# Patient Record
Sex: Male | Born: 1990 | Race: Black or African American | Hispanic: No | Marital: Single
Health system: Midwestern US, Academic
[De-identification: ages and names within clinical notes are randomized; demographics above are authoritative.]

---
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
## 2020-02-01 ENCOUNTER — Emergency Department: Admit: 2020-02-01 | Discharge: 2020-02-01

## 2020-02-01 ENCOUNTER — Encounter: Admit: 2020-02-01 | Discharge: 2020-02-01

## 2020-02-01 MED ORDER — FENTANYL CITRATE (PF) 50 MCG/ML IJ SOLN
25 ug | Freq: Once | INTRAVENOUS | 0 refills | Status: CP
Start: 2020-02-01 — End: ?

## 2020-02-01 MED ORDER — TETRACAINE HCL (PF) 0.5 % OP DROP
1 [drp] | Freq: Once | OPHTHALMIC | 0 refills | Status: CP
Start: 2020-02-01 — End: ?
  Administered 2020-02-02: 03:00:00 1 [drp] via OPHTHALMIC

## 2020-02-01 MED ORDER — FLUORESCEIN 1 MG OP STRP
1 | Freq: Once | OPHTHALMIC | 0 refills | Status: CP
Start: 2020-02-01 — End: ?
  Administered 2020-02-02: 03:00:00 1 via OPHTHALMIC

## 2020-02-02 MED ORDER — ERYTHROMYCIN 5 MG/GRAM (0.5 %) OP OINT
1 [in_us] | Freq: Two times a day (BID) | OPHTHALMIC | 0 refills | 18.50000 days | Status: AC
Start: 2020-02-02 — End: ?

## 2020-02-02 MED ORDER — OXYCODONE-ACETAMINOPHEN 5-325 MG PO TAB
1 | ORAL_TABLET | ORAL | 0 refills | 2.00000 days | Status: AC | PRN
Start: 2020-02-02 — End: ?

## 2020-02-02 MED ORDER — DEXTRAN 70-HYPROMELLOSE 0.1-0.3 % OP DROP
1 [drp] | Freq: Four times a day (QID) | OPHTHALMIC | 0 refills | Status: AC
Start: 2020-02-02 — End: ?
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
## 2020-02-04 ENCOUNTER — Ambulatory Visit: Admit: 2020-02-04 | Discharge: 2020-02-04

## 2020-02-04 ENCOUNTER — Encounter: Admit: 2020-02-04 | Discharge: 2020-02-04

## 2020-02-04 DIAGNOSIS — S0590XA Unspecified injury of unspecified eye and orbit, initial encounter: Secondary | ICD-10-CM

## 2020-02-04 DIAGNOSIS — I10 Essential (primary) hypertension: Secondary | ICD-10-CM

## 2020-02-04 DIAGNOSIS — H468 Other optic neuritis: Secondary | ICD-10-CM

## 2020-02-04 MED ORDER — PREDNISOLONE ACETATE 1 % OP DRPS
1 [drp] | Freq: Four times a day (QID) | OPHTHALMIC | 0 refills | 5.00000 days | Status: AC
Start: 2020-02-04 — End: ?

## 2020-02-04 MED ORDER — OXYCODONE-ACETAMINOPHEN 5-325 MG PO TAB
1 | ORAL_TABLET | ORAL | 0 refills | 2.00000 days | Status: AC | PRN
Start: 2020-02-04 — End: ?

## 2020-02-04 MED ORDER — CYCLOPENTOLATE 1 % OP DROP
1 [drp] | Freq: Three times a day (TID) | OPHTHALMIC | 0 refills | 31.00000 days | Status: AC
Start: 2020-02-04 — End: ?

## 2020-02-04 MED ORDER — METHYLPREDNISOLONE 4 MG PO DSPK
ORAL_TABLET | 1 refills | Status: AC
Start: 2020-02-04 — End: ?

## 2020-02-04 NOTE — Progress Notes
Body mass index is 28.21 kg/m?Terrence Singleton  Discussed patient's BMI with him.  The body mass index is 28.21 kg/m?Terrence Singleton and falls within the category of Overweight (25 to <30); specialist visit only, referred back to Primary Care Provider for follow up.      Assessment&Plan:      Traumatic optic neuropathy OS  Traumatic iritis OS  Medial and orbital floor fractures left side  Chemosis OS  Motility restriction  IOP wnl    Will hold off on scheduling surgical repair until seen in comprehensive for TON  Start Medrol dose pack for TON  Start PF QID OS and cyclopentolate TID OS for traumatic iritis  Cont AT QID OS, erythromycin ointment BID  F/u [facility] Wednesday check HVF  F/u [facility] Friday    CT reviewed  OCT reviewed with swelling OS    Oculofacial Plastic and Orbital Surgery Service.    Patient seen and examined with resident. Agree with findings and plan.    Ezequiel Essex, MD  Director of Oculofacial Plastic and Orbital Surgery  Pager [phone]

## 2020-02-06 ENCOUNTER — Ambulatory Visit: Admit: 2020-02-06 | Discharge: 2020-02-06

## 2020-02-06 ENCOUNTER — Encounter: Admit: 2020-02-06 | Discharge: 2020-02-06

## 2020-02-06 DIAGNOSIS — I10 Essential (primary) hypertension: Secondary | ICD-10-CM

## 2020-02-06 DIAGNOSIS — S0285XD Fracture of orbit, unspecified, subsequent encounter for fracture with routine healing: Secondary | ICD-10-CM

## 2020-02-06 DIAGNOSIS — S0590XA Unspecified injury of unspecified eye and orbit, initial encounter: Secondary | ICD-10-CM

## 2020-02-06 DIAGNOSIS — H468 Other optic neuritis: Secondary | ICD-10-CM

## 2020-02-06 DIAGNOSIS — H209 Unspecified iridocyclitis: Secondary | ICD-10-CM

## 2020-02-06 NOTE — Patient Instructions
Cyclogyl (red top) twice daily LEFT eye  Prednisilone (pink top) four times daily LEFT eye  Erythromycin ointment at BEDTIME LEFT eye    Use artificial tears one drop four times daily to both eyes as needed.  Tears can be used up to every hour. If using more than four times daily on a regular basis, be sure to use a PRESERVATIVE FREE formulation.    Artificial tears are also called lubricant drops. Suggested brands include TheraTears, Refresh, Systane, Blink, and Genteal.

## 2020-02-06 NOTE — Assessment & Plan Note
Following with Dr. Wilma Flavin for possible repair of fractures

## 2020-02-08 ENCOUNTER — Encounter: Admit: 2020-02-08 | Discharge: 2020-02-08

## 2020-02-08 ENCOUNTER — Ambulatory Visit: Admit: 2020-02-08 | Discharge: 2020-02-08

## 2020-02-08 DIAGNOSIS — H209 Unspecified iridocyclitis: Secondary | ICD-10-CM

## 2020-02-08 DIAGNOSIS — S0285XD Fracture of orbit, unspecified, subsequent encounter for fracture with routine healing: Secondary | ICD-10-CM

## 2020-02-08 DIAGNOSIS — H468 Other optic neuritis: Secondary | ICD-10-CM

## 2020-02-08 DIAGNOSIS — I10 Essential (primary) hypertension: Secondary | ICD-10-CM

## 2020-02-08 DIAGNOSIS — S0590XA Unspecified injury of unspecified eye and orbit, initial encounter: Secondary | ICD-10-CM

## 2020-02-08 NOTE — Progress Notes
There is no height or weight on file to calculate BMI.             Assessment and Plan:            Traumatic optic neuropathy OS  Traumatic iritis OS  Medial and orbital floor fractures left side  Chemosis OS- better  Motility restriction- better  IOP wnl  ?  Will hold off on scheduling surgical repair until seen in comprehensive for TON  complete Medrol dose pack for TON  Cont PF QID OS and cyclopentolate TID OS for traumatic iritis  Cont AT QID OS, erythromycin ointment BID  F/u [facility] next week  F/u [facility] 2 weeks  ?  CT reviewed  OCT reviewed with swelling OS  ?    Hold off on Orbit repair for now due to optic neuropathy, revisit in 2 weeks to consider rerpair  He will see PRS for nasal fx

## 2020-02-13 ENCOUNTER — Ambulatory Visit: Admit: 2020-02-13 | Discharge: 2020-02-13

## 2020-02-13 ENCOUNTER — Encounter: Admit: 2020-02-13 | Discharge: 2020-02-13

## 2020-02-13 DIAGNOSIS — H468 Other optic neuritis: Secondary | ICD-10-CM

## 2020-02-13 DIAGNOSIS — H209 Unspecified iridocyclitis: Secondary | ICD-10-CM

## 2020-02-13 DIAGNOSIS — S0590XA Unspecified injury of unspecified eye and orbit, initial encounter: Secondary | ICD-10-CM

## 2020-02-13 DIAGNOSIS — I10 Essential (primary) hypertension: Secondary | ICD-10-CM

## 2020-02-13 DIAGNOSIS — S0285XD Fracture of orbit, unspecified, subsequent encounter for fracture with routine healing: Secondary | ICD-10-CM

## 2020-02-13 NOTE — Assessment & Plan Note
Stop cyclogyl    prednisilone three times daily x 2 weeks, then twice daily x 2 weeks, then once daily x 2 weeks    Stop ointment at bedtime    Use artificial tears one drop four times daily to both eyes.  Tears can be used up to every hour. If using more than four times daily on a regular basis, be sure to use a PRESERVATIVE FREE formulation.    Artificial tears are also called lubricant drops. Suggested brands include TheraTears, Refresh, Systane, Blink, and Genteal.

## 2020-02-13 NOTE — Patient Instructions
Stop cyclogyl    prednisilone three times daily x 2 weeks (through Aug. 7), then twice daily x 2 weeks (Sept 8 - Sept 21), then once daily x 2 weeks (Sept 22- October 5)    Stop ointment at bedtime    Use artificial tears one drop four times daily to both eyes.  Tears can be used up to every hour. If using more than four times daily on a regular basis, be sure to use a PRESERVATIVE FREE formulation.    Artificial tears are also called lubricant drops. Suggested brands include TheraTears, Refresh, Systane, Blink, and Genteal.
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
## 2020-02-13 NOTE — Progress Notes
Assessment and Plan:    Problem   Traumatic Iritis       Traumatic iritis  Stop cyclogyl    prednisilone three times daily x 2 weeks, then twice daily x 2 weeks, then once daily x 2 weeks    Stop ointment at bedtime    Use artificial tears one drop four times daily to both eyes.  Tears can be used up to every hour. If using more than four times daily on a regular basis, be sure to use a PRESERVATIVE FREE formulation.    Artificial tears are also called lubricant drops. Suggested brands include TheraTears, Refresh, Systane, Blink, and Genteal.               Return in about 4 weeks (around 03/12/2020) for [employer] Return, OCT nerve - traumatic optic neuropathy.    Corky Sox, MD  [facility]      HPI:  Patient presents with:  Eye Problem: follow up orbital fracture; traumatic iritis OS. Vision is blurry. He denies any pain around the eye, but does note pain with eye movement when he looks up and to the left.  Treatment: Cyclo, Pred forte, erythromycin ung        Exam:  Base Eye Exam     Visual Acuity (Snellen - Linear)       Right Left    Dist sc 20/20 -2 20/40 -2    Dist ph sc  NI          Tonometry (Tonopen, 3:15 PM)       Right Left    Pressure 16 13          Pupils       Pupils Dark Shape React APD    Right PERRL 3 Round Brisk None    Left PERRL Dilated Round fixed None          Visual Fields       Left Right     Full Full          Extraocular Movement       Right Left     Full Full          Neuro/Psych     Oriented x3: Yes    Mood/Affect: Normal            Slit Lamp and Fundus Exam     External Exam       Right Left    External Normal enophthalmos          Slit Lamp Exam       Right Left    Lids/Lashes Normal Normal    Conjunctiva/Sclera White and quiet White and quiet    Cornea Clear Clear    Anterior Chamber Deep and quiet Deep and quiet    Iris Flat Flat    Lens Clear anterior subcapsular cataract          Fundus Exam       Right Left    Vitreous  Normal    Disc  Optic disc edema trace superiorly    C/D Ratio  0.1    Macula  Flat    Vessels  small hemorrhage along inferior arcade    Periphery  Attached, no breaks or tears

## 2020-02-13 NOTE — Telephone Encounter
Pt stopped at checkout needing to schedule a 4 week FUV with HVF with Dr Sharlette Dense.    Right now she is booked until October 28th... Would you please give them a call to get them scheduled? Thank you!!!

## 2020-02-14 ENCOUNTER — Encounter: Admit: 2020-02-14 | Discharge: 2020-02-14

## 2020-02-14 NOTE — Telephone Encounter
I called the pt to scheduled a Return in about 4 weeks (around 03/12/2020) for [employer] Return, OCT nerve - traumatic optic neuropathy per Dr. Sharlette Dense but she does not have any openings in the time frame. When would you like me to schedule this?

## 2020-02-14 NOTE — Telephone Encounter
Patient called the triage line to find out if he has any work restrictions or if he is okay to return to work.

## 2020-02-15 ENCOUNTER — Ambulatory Visit: Admit: 2020-02-15 | Discharge: 2020-02-15

## 2020-02-15 ENCOUNTER — Encounter: Admit: 2020-02-15 | Discharge: 2020-02-15

## 2020-02-15 ENCOUNTER — Ambulatory Visit: Admit: 2020-02-21 | Discharge: 2020-02-21

## 2020-02-15 DIAGNOSIS — S0232XA Fracture of orbital floor, left side, initial encounter for closed fracture: Principal | ICD-10-CM

## 2020-02-15 DIAGNOSIS — Z20822 Encounter for screening laboratory testing for COVID-19 virus in asymptomatic patient: Secondary | ICD-10-CM

## 2020-02-18 ENCOUNTER — Encounter: Admit: 2020-02-18 | Discharge: 2020-02-18

## 2020-02-18 ENCOUNTER — Ambulatory Visit: Admit: 2020-02-18 | Discharge: 2020-02-18

## 2020-02-18 DIAGNOSIS — S0285XD Fracture of orbit, unspecified, subsequent encounter for fracture with routine healing: Secondary | ICD-10-CM

## 2020-02-18 DIAGNOSIS — I10 Essential (primary) hypertension: Secondary | ICD-10-CM

## 2020-02-18 DIAGNOSIS — S0590XA Unspecified injury of unspecified eye and orbit, initial encounter: Secondary | ICD-10-CM

## 2020-02-18 DIAGNOSIS — Z20822 Encounter for screening laboratory testing for COVID-19 virus in asymptomatic patient: Secondary | ICD-10-CM

## 2020-02-18 NOTE — Patient Instructions
Facial Fracture   You have a broken bone, or fracture, in your face. This may be a small crack in the bone. Or it may be a major break, with the bone moved out of place.   Depending on where the break is, you may have pain when you chew. You may also have nasal congestion, sinus pain, and nose bleeding.    During the first 24 hours after injury, you may have swelling or bruising where the break is, or around your eyes. A blow to the face strong enough to cause a broken bone may also cause a concussion or more serious brain injury.   Home care  · Use an ice pack on the injured area for no more than 15 to 20 minutes at a time. Do this every 1 to 2 hours for the first 24 to 48 hours. Then use the ice pack as needed to ease pain and swelling. To make an ice pack, put ice cubes in a plastic bag that seals at the top. Wrap the bag in a clean, thin towel or cloth. Never put ice or an ice pack directly on the skin.  · You may use over-the-counter pain medicine to control pain, unless another pain medicine was prescribed. Talk with your provider before using this medicine if you have chronic liver or kidney disease or a history of gastrointenstinal ulcers.  · Sleep with your head raised on 2 or more pillows to ease swelling.  · If you have facial pain when eating, don’t eat crunchy or chewy foods. A softer diet will be more comfortable for the first 2 to 3 weeks.  · If you were given antibiotics to prevent an infection, take them as directed until you have finished the prescription.  · If your nose bleeds, sit up and lean forward. Pinch your nostrils together for 10 to 15 minutes. If the bleeding doesn’t stop, keep pinching your nostrils and call your healthcare provider. Don’t blow your nose for 12 hours after the bleeding stops. This will allow a strong blood clot to form. Don’t pick your nose.    Special note on concussions  If you had any symptoms of a concussion today, don’t return to sports or any activity that could  result in another head injury.   These are symptoms of a concussion:  · Nausea  · Vomiting  · Dizziness  · Confusion  · Headache  · Memory loss  · Loss of consciousness  Wait until all of your symptoms are gone and your provider says it’s OK to resume your activity. Having a second head injury before you fully recover from the first one can lead to serious brain injury.   Follow-up care  Follow up with your healthcare provider in 1 week, or as advised. This is to make sure the bone is healing as it should.   If you had X-rays or CT scans taken, you will be told of any new findings that may affect your care.   When to seek medical advice  Call your healthcare provider right away if any of these occur:  · Swelling or pain in your face that gets worse  · Redness, warmth, or pus draining from the injured area  · Fever of 100.4°F (38°C) or higher, or as directed by your healthcare provider  · Chills  · Double vision  · Nausea  Call 911  Call 911 if you have:   · Repeated vomiting  · Severe headache or dizziness  · Headache or dizziness that gets worse  · Abnormal drowsiness, or you are unable to wake   up as usual  · Confusion or change in behavior or speech  · Convulsion, or seizure   [employer] last reviewed this educational content on 09/19/2016  © 2000-2021 [employer]. All rights reserved. This information is not intended as a substitute for professional medical care. Always follow your healthcare professional's instructions.

## 2020-02-18 NOTE — Progress Notes
Body mass index is 27.66 kg/m?Terrence Singleton        Discussed patient's BMI with him.  The body mass index is 27.66 kg/m?Terrence Singleton and falls within the category of Overweight (25 to <30); specialist visit only, referred back to Primary Care Provider for follow up.       Assessment and Plan:              Traumatic optic neuropathy OS  Traumatic iritis OS- better cont taper per Dr Sharlette Dense  Medial and orbital floor fractures left side  Also, ?traumatic cataract (observe)  Chemosis OS- better  Motility restriction- better  IOP wnl    discussed risk of surgery with TON and vision loss    Plan for left floor repair +/- medial wall. Duw to optic neuropathy may under correct and not repair medial wall at this time to decrease surgical time and retraction risk with his optic neuorpathy. This may lead to under correction. May need to return later to address the medial wall.    ORBITAL TRAUMA: The patient presents today with orbital fractures involving: left Orbital floor, orbital medial wall fractures. The patient's clinical examination is significant for the following: Double vision secondary to restriction and enophthalmos. At this time, I am recommending that the patient consider orbital fracture repair. I have discussed the risks and benefits of the procedure with the patient, which include, but are not limited to the following: Loss of vision, loss of eye, bleeding, enophthalmos, lid retraction, double vision, or need for further surgery. We have reviewed the imaging studies as well as exam findings with the patient. All questions were answered.    Needs covid test  Will try to coordinate with Dr Eben Burow so he can do the nasal repair

## 2020-02-19 LAB — COVID-19 (SARS-COV-2) PCR

## 2020-02-21 ENCOUNTER — Encounter: Admit: 2020-02-21 | Discharge: 2020-02-21

## 2020-02-21 ENCOUNTER — Ambulatory Visit: Admit: 2020-02-21 | Discharge: 2020-02-21

## 2020-02-21 DIAGNOSIS — S022XXA Fracture of nasal bones, initial encounter for closed fracture: Secondary | ICD-10-CM

## 2020-02-21 DIAGNOSIS — H468 Other optic neuritis: Secondary | ICD-10-CM

## 2020-02-21 DIAGNOSIS — S0232XA Fracture of orbital floor, left side, initial encounter for closed fracture: Secondary | ICD-10-CM

## 2020-02-21 DIAGNOSIS — S0590XA Unspecified injury of unspecified eye and orbit, initial encounter: Secondary | ICD-10-CM

## 2020-02-21 DIAGNOSIS — I10 Essential (primary) hypertension: Secondary | ICD-10-CM

## 2020-02-21 MED ORDER — MIDAZOLAM 1 MG/ML IJ SOLN
INTRAVENOUS | 0 refills | Status: DC
Start: 2020-02-21 — End: 2020-02-21
  Administered 2020-02-21: 19:00:00 2 mg via INTRAVENOUS

## 2020-02-21 MED ORDER — ARTIFICIAL TEARS SINGLE DOSE DROPS GROUP
OPHTHALMIC | 0 refills | Status: DC
Start: 2020-02-21 — End: 2020-02-21
  Administered 2020-02-21: 19:00:00 1 [drp] via OPHTHALMIC

## 2020-02-21 MED ORDER — FENTANYL CITRATE (PF) 50 MCG/ML IJ SOLN
INTRAVENOUS | 0 refills | Status: DC
Start: 2020-02-21 — End: 2020-02-21
  Administered 2020-02-21: 19:00:00 50 ug via INTRAVENOUS
  Administered 2020-02-21: 19:00:00 100 ug via INTRAVENOUS

## 2020-02-21 MED ORDER — CEFAZOLIN 1 GRAM IJ SOLR
INTRAVENOUS | 0 refills | Status: DC
Start: 2020-02-21 — End: 2020-02-21
  Administered 2020-02-21: 20:00:00 2 g via INTRAVENOUS

## 2020-02-21 MED ORDER — PHENYLEPHRINE HCL IN 0.9% NACL 1 MG/10 ML (100 MCG/ML) IV SYRG
INTRAVENOUS | 0 refills | Status: DC
Start: 2020-02-21 — End: 2020-02-21
  Administered 2020-02-21 (×3): 100 ug via INTRAVENOUS

## 2020-02-21 MED ORDER — SUCCINYLCHOLINE CHLORIDE 20 MG/ML IJ SOLN
INTRAVENOUS | 0 refills | Status: DC
Start: 2020-02-21 — End: 2020-02-21
  Administered 2020-02-21: 19:00:00 120 mg via INTRAVENOUS

## 2020-02-21 MED ORDER — LIDOCAINE (PF) 20 MG/ML (2 %) IJ SOLN
INTRAVENOUS | 0 refills | Status: DC
Start: 2020-02-21 — End: 2020-02-21
  Administered 2020-02-21: 19:00:00 100 mg via INTRAVENOUS

## 2020-02-21 MED ORDER — DEXMEDETOMIDINE IN 0.9 % NACL 20 MCG/5 ML (4 MCG/ML) IV SYRG
INTRAVENOUS | 0 refills | Status: DC
Start: 2020-02-21 — End: 2020-02-21
  Administered 2020-02-21 (×2): 4 ug via INTRAVENOUS
  Administered 2020-02-21 (×4): 8 ug via INTRAVENOUS

## 2020-02-21 MED ORDER — PROPOFOL INJ 10 MG/ML IV VIAL
INTRAVENOUS | 0 refills | Status: DC
Start: 2020-02-21 — End: 2020-02-21
  Administered 2020-02-21: 19:00:00 200 mg via INTRAVENOUS
  Administered 2020-02-21: 20:00:00 10 mg via INTRAVENOUS

## 2020-02-21 MED ORDER — ROCURONIUM 10 MG/ML IV SOLN
INTRAVENOUS | 0 refills | Status: DC
Start: 2020-02-21 — End: 2020-02-21
  Administered 2020-02-21: 19:00:00 30 mg via INTRAVENOUS
  Administered 2020-02-21: 20:00:00 20 mg via INTRAVENOUS

## 2020-02-21 MED ORDER — METHYLPREDNISOLONE SOD SUC(PF) 125 MG/2 ML IJ SOLR
INTRAVENOUS | 0 refills | Status: DC
Start: 2020-02-21 — End: 2020-02-21
  Administered 2020-02-21: 20:00:00 250 mg via INTRAVENOUS

## 2020-02-21 MED ORDER — SUGAMMADEX 100 MG/ML IV SOLN
INTRAVENOUS | 0 refills | Status: DC
Start: 2020-02-21 — End: 2020-02-21
  Administered 2020-02-21: 20:00:00 180 mg via INTRAVENOUS

## 2020-02-21 MED ORDER — ONDANSETRON HCL (PF) 4 MG/2 ML IJ SOLN
INTRAVENOUS | 0 refills | Status: DC
Start: 2020-02-21 — End: 2020-02-21
  Administered 2020-02-21: 20:00:00 4 mg via INTRAVENOUS

## 2020-02-21 MED ORDER — DEXAMETHASONE SODIUM PHOSPHATE 4 MG/ML IJ SOLN
INTRAVENOUS | 0 refills | Status: DC
Start: 2020-02-21 — End: 2020-02-21
  Administered 2020-02-21: 20:00:00 4 mg via INTRAVENOUS

## 2020-02-21 MED ADMIN — OXYCODONE 5 MG PO TAB [10814]: 10 mg | ORAL | @ 21:00:00 | Stop: 2020-02-21 | NDC 00406055223

## 2020-02-21 MED ADMIN — ERYTHROMYCIN 5 MG/GRAM (0.5 %) OP OINT [2888]: .25 [in_us] | OPHTHALMIC | @ 21:00:00 | Stop: 2020-02-21 | NDC 24208091019

## 2020-02-21 MED ADMIN — FENTANYL CITRATE (PF) 50 MCG/ML IJ SOLN [3037]: 50 ug | INTRAVENOUS | @ 21:00:00 | Stop: 2020-02-22 | NDC 00641602701

## 2020-02-21 MED ADMIN — BUPIVACAINE (PF) 0.5 % (5 MG/ML) IJ SOLN [87867]: 6 mL | INTRAMUSCULAR | @ 21:00:00 | Stop: 2020-02-21 | NDC 00409116201

## 2020-02-21 MED ADMIN — LACTATED RINGERS IV SOLP [4318]: 1000 mL | INTRAVENOUS | @ 18:00:00 | Stop: 2020-02-21 | NDC 00338011704

## 2020-02-21 MED ADMIN — ACETAMINOPHEN 500 MG PO TAB [102]: 1000 mg | ORAL | @ 19:00:00 | Stop: 2020-02-21 | NDC 00904673061

## 2020-02-21 MED ADMIN — LIDOCAINE-EPINEPHRINE 2 %-1:100,000 IJ SOLN [15954]: 6 mL | INTRAMUSCULAR | @ 21:00:00 | Stop: 2020-02-21 | NDC 63323048303

## 2020-02-21 MED ADMIN — BALANCED SALT SOLN NO.2 IRRIG. IO SOLN [37578]: 15 mL | OPHTHALMIC | @ 21:00:00 | Stop: 2020-02-21 | NDC 00065079515

## 2020-02-21 NOTE — Anesthesia Post-Procedure Evaluation
Post-Anesthesia Evaluation    Name: Terrence Singleton.      MRN: [id number]     DOB: 01/17/1991     Age: 29 y.o.     Sex: male   __________________________________________________________________________     Procedure Information     Anesthesia Start Date/Time: 02/21/20 1406    Procedures:       PERIORBITAL RECONSTRUCTION ORBIT FLOOR BLOWOUT FRACTURE WITH IMPLANT (Left Face) - TOTAL CASE LENGTH 1.5 HRS, REQUEST START AFTER 1400      CLOSED TREATMENT AND STABILIZATION NASAL BONE FRACTURE (Bilateral Nose) - 30 MINUTES    Location: MAIN OR 16 / Main OR/Periop    Surgeons: Ezequiel Essex, MD; Windy Carina, MD          Post-Anesthesia Vitals  BP: 121/83 (09/02 1630)  Pulse: 74 (09/02 1630)  Respirations: 16 PER MINUTE (09/02 1630)  SpO2: 98 % (09/02 1630)  SpO2 Pulse: 74 (09/02 1630)   Vitals Value Taken Time   BP 121/83 02/21/20 1630   Temp 36.5 ?C (97.7 ?F) 02/21/20 1530   Pulse 74 02/21/20 1630   Respirations 16 PER MINUTE 02/21/20 1630   SpO2 98 % 02/21/20 1630         Post Anesthesia Evaluation Note    Evaluation location: Pre/Post  Patient participation: recovered; patient participated in evaluation  Level of consciousness: alert    Pain score: 4  Pain management: adequate    Hydration: normovolemia  Temperature: 36.0?C - 38.4?C  Airway patency: adequate    Perioperative Events       Post-op nausea and vomiting: no PONV    Postoperative Status  Cardiovascular status: hemodynamically stable  Respiratory status: spontaneous ventilation  Follow-up needed: none  Additional comments: The patient was evaluated at the bedside and meets criteria for discharge from PACU at this time. The patient is agreeable to discharge and agrees to be under the care of a responsible adult for 24 hours following administration of anesthesia. Strict return precautions were discussed with the patient. A physical copy of discharge paperwork was provided, including pertinent contact information for the patient's surgical provider and the [facility]    Domenica Reamer, DO  Anesthesiology PGY-3        Perioperative Events  Perioperative Event: No  Emergency Case Activation: No

## 2020-02-22 ENCOUNTER — Encounter: Admit: 2020-02-22 | Discharge: 2020-02-22

## 2020-02-22 NOTE — Progress Notes
Called for post op check   Doing well  vision is ok  Pain controlled   Questions answered

## 2020-02-23 ENCOUNTER — Encounter: Admit: 2020-02-23 | Discharge: 2020-02-23

## 2020-02-23 DIAGNOSIS — S0590XA Unspecified injury of unspecified eye and orbit, initial encounter: Secondary | ICD-10-CM

## 2020-02-23 DIAGNOSIS — I10 Essential (primary) hypertension: Secondary | ICD-10-CM

## 2020-02-26 ENCOUNTER — Encounter: Admit: 2020-02-26 | Discharge: 2020-02-26

## 2020-02-26 NOTE — Telephone Encounter
9/3 Noreene Larsson from office of  Lollie Sails, MD [phone]  Left message stating pt was seen by Dr Wilma Flavin and has upcoming appt on 9/2  She asks if Dr Wilma Flavin would consider removing sutures near eye   She says Dr Sharol Harness doesn't feel comfortable removing them     9/7 Returned call to [facility] Left message with [facility]

## 2020-02-28 ENCOUNTER — Encounter: Admit: 2020-02-28 | Discharge: 2020-02-28

## 2020-02-28 ENCOUNTER — Ambulatory Visit: Admit: 2020-02-28 | Discharge: 2020-02-29

## 2020-02-28 DIAGNOSIS — S0285XD Fracture of orbit, unspecified, subsequent encounter for fracture with routine healing: Secondary | ICD-10-CM

## 2020-02-28 NOTE — Progress Notes
There is no height or weight on file to calculate BMI.             Assessment and Plan:            s/p Left orbit repair  Medial wall not repaired due to TON  + orbit precautions  EOM exercises and stretch lid      Traumatic iritis  CPM with PF per Dr Sharlette Dense

## 2020-03-05 ENCOUNTER — Ambulatory Visit: Admit: 2020-03-05 | Discharge: 2020-03-06

## 2020-03-05 ENCOUNTER — Encounter: Admit: 2020-03-05 | Discharge: 2020-03-05

## 2020-03-05 DIAGNOSIS — I10 Essential (primary) hypertension: Secondary | ICD-10-CM

## 2020-03-05 DIAGNOSIS — S0590XA Unspecified injury of unspecified eye and orbit, initial encounter: Secondary | ICD-10-CM

## 2020-03-05 DIAGNOSIS — S022XXD Fracture of nasal bones, subsequent encounter for fracture with routine healing: Secondary | ICD-10-CM

## 2020-03-05 NOTE — Progress Notes
Subjective:       History of Present Illness  Terrence Singleton. is a 29 y.o. male s/p closed reduction nasal fracture on 9/2. He feels fine today and has no concerns.        Review of Systems   Constitutional: Negative.    HENT: Negative.    Eyes: Negative.    Respiratory: Negative.    Cardiovascular: Negative.    Gastrointestinal: Negative.    Endocrine: Negative.    Genitourinary: Negative.    Musculoskeletal: Negative.    Skin: Negative.    Allergic/Immunologic: Negative.    Neurological: Negative.    Hematological: Negative.    Psychiatric/Behavioral: Negative.          Objective:         ? cyclopentolate (CYCLOGYL) 1 % ophthalmic solution Apply one drop to left eye as directed three times daily.   ? erythromycin (ROMYCIN) 5 mg/gram (0.5 %) ophthalmic ointment Apply one inch to left eye as directed three times daily.   ? HYDROcodone/acetaminophen (NORCO) 5/325 mg tablet Take one tablet by mouth every 6 hours as needed for Pain   ? prednisolone acetate (PRED FORTE) 1 % ophthalmic suspension Apply one drop to left eye as directed four times daily.     Vitals:    03/05/20 0806   Weight: 84.4 kg (186 lb)   Height: 177.8 cm (70)   PainSc: Zero     Body mass index is 26.69 kg/m?Marland Kitchen     Physical Exam  Gen: alert, NAD  HEENT: Nasal symmetry significantly improved; airway patent b/l       Assessment and Plan:  Gentle nose blowing okay  Nasal saline rinses prn  F/u prn

## 2020-03-10 ENCOUNTER — Encounter: Admit: 2020-03-10 | Discharge: 2020-03-10

## 2020-03-10 ENCOUNTER — Ambulatory Visit: Admit: 2020-03-10 | Discharge: 2020-03-11

## 2020-03-10 NOTE — Assessment & Plan Note
S/p repair with Dr. Wilma Flavin - keep appointment with Dr. Wilma Flavin next month

## 2020-03-10 NOTE — Assessment & Plan Note
HVF slighlty improved - VFI 35% OS    Explained to pt that visoin may continue to improve a small amount OS, but he will have permanent damage to his vision OS due to traumatic optic neuropathy

## 2020-03-10 NOTE — Assessment & Plan Note
Decrease PF to once daily x 2 weeks, then stop    Iritis resolved today

## 2020-03-10 NOTE — Progress Notes
Assessment and Plan:    Problem   Traumatic Iritis   Open Orbital Fracture, With Routine Healing, Subsequent Encounter   Traumatic Optic Neuropathy       Traumatic optic neuropathy  HVF slighlty improved - VFI 35% OS    Explained to pt that visoin may continue to improve a small amount OS, but he will have permanent damage to his vision OS due to traumatic optic neuropathy    Traumatic iritis  Decrease PF to once daily x 2 weeks, then stop    Iritis resolved today    Open orbital fracture, with routine healing, subsequent encounter  S/p repair with Dr. Wilma Flavin - keep appointment with Dr. Wilma Flavin next month           Return in about 3 months (around 06/09/2020) for [employer] Return, OCT nerve, MRx - trauamtic optic neuropathy.    Corky Sox, MD  [place] [facility]      HPI:  Patient presents with:  Eye Exam: FU; Os is irritated due to stitch. Still has double occasionally. Mostly in the AM or if he moves too fast. OS va is further back than OD va.   Treatment: PF bid OS. Ats prn.   Eye Pain: Occasional pain OS, achey nagging eye pain. Not daily, not constant.         Exam:  Base Eye Exam     Visual Acuity (Snellen - Linear)       Right Left    Dist sc 20/20 -1 20/30 +4          Tonometry (Tonopen, 7:58 AM)       Right Left    Pressure 17 27          Tonometry #2 (Applanation, 8:17 AM)       Right Left    Pressure  19          Pupils       APD    Right     Left 1+ APD          Extraocular Movement       Right Left     Full, Ortho Full, Ortho          Neuro/Psych     Oriented x3: Yes    Mood/Affect: Normal            Slit Lamp and Fundus Exam     External Exam       Right Left    External Normal trace enophthalmos          Slit Lamp Exam       Right Left    Lids/Lashes Normal tr lid edema    Conjunctiva/Sclera White and quiet Small SCH nasally    Cornea Clear Clear    Anterior Chamber Deep and quiet Deep and quiet    Iris Flat Flat    Lens tr ant caps cat anterior capsular cataract Fundus Exam       Right Left    Vitreous  Normal    Disc  No disc edema, mild apllor    C/D Ratio  0.1                SCAN COMP OPHTHAL DIAG IMG, OP NERVE     OCT Left Eye   Left Eye  Location: Temporal  Optic Nerve Findings: Abnormal Thinning    Right Eye   Right Eye  Optic Nerve Findings: Normal   No edema  OS       VISUAL FIELD, EXTEND     Visual Field Examination Type  [facility] Automated  Laterality   Both eyes  Right Eye  Threshold: 24-2  Reliability:  GoodResults:  Normal and Stable  Left Eye  Threshold: 24-2  Reliability:  GoodResults:  ImprovementDescription:  [address]

## 2020-03-17 ENCOUNTER — Encounter: Admit: 2020-03-17 | Discharge: 2020-03-17

## 2020-03-24 ENCOUNTER — Encounter: Admit: 2020-03-24 | Discharge: 2020-03-24

## 2020-03-24 NOTE — Telephone Encounter
PT called triage line, he needs a work release form.   I called PT and he says that he has been communicating with Carollee Herter about paperwork on Friday and his last message to her was that yes, he wants to return to work now but he did not hear back from her Friday. I let  Him know Carollee Herter was in clinic Friday afternoon and is in clinic all day today. Carollee Herter is very good with patient communication and will get back to him today or tomorrow and I will also let her know that you would like to go to work asap.

## 2020-03-25 ENCOUNTER — Encounter: Admit: 2020-03-25 | Discharge: 2020-03-25

## 2020-03-25 NOTE — Telephone Encounter
-----   Message from [place] sent at 03/21/2020 12:56 PM CDT -----  Regarding: FW: Non-Urgent Medical Question  Contact: [phone]    ----- Message -----  From: Berline Lopes.  Sent: 03/21/2020   8:00 AM CDT  To: [facility] Nurse [facility]  Subject: RE: Non-Urgent Medical Question                  I need that sent now if possible please and thank you

## 2020-04-28 ENCOUNTER — Ambulatory Visit: Admit: 2020-04-28 | Discharge: 2020-04-29

## 2020-04-28 ENCOUNTER — Encounter: Admit: 2020-04-28 | Discharge: 2020-04-28

## 2020-04-28 NOTE — Progress Notes
There is no height or weight on file to calculate BMI.             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  ?  - monocular precautions: discussed with pt that due to their monocular status they are at higher-than-normal risk of trauma and injury to their good eye; we recommend glasses made with protective polycarbonate lenses and counseled full-time wear of these glasses    F/u 3 months

## 2020-04-29 DIAGNOSIS — H468 Other optic neuritis: Secondary | ICD-10-CM

## 2020-04-29 DIAGNOSIS — S0285XD Fracture of orbit, unspecified, subsequent encounter for fracture with routine healing: Secondary | ICD-10-CM

## 2020-05-05 ENCOUNTER — Encounter: Admit: 2020-05-05 | Discharge: 2020-05-05

## 2020-06-16 ENCOUNTER — Encounter: Admit: 2020-06-16 | Discharge: 2020-06-16

## 2020-06-16 ENCOUNTER — Ambulatory Visit: Admit: 2020-06-16 | Discharge: 2020-06-16

## 2020-06-16 DIAGNOSIS — H468 Other optic neuritis: Secondary | ICD-10-CM

## 2020-06-16 NOTE — Progress Notes
Assessment and Plan:    Problem   Traumatic Optic Neuropathy       Traumatic optic neuropathy  Slight improvement on HVF, but optic nerve pallor    Explained to pt that vision will not fully recover    Recommend polycarbonate glasses - RX given           Return in about 1 year (around 06/16/2021) for [employer] Return, Dilated exam, MRx.    Madelaine Etienne, MD  [place] [facility]      HPI:  Patient presents with:  Follow Up: 3 month FUV for traumatic optic neuropathy. Pt says vision has improved since last visit.         Exam:  Base Eye Exam     Visual Acuity (Snellen - Linear)       Right Left    Dist sc 20/20 20/20 -1          Tonometry (Tonopen, 11:06 AM)       Right Left    Pressure 21 19          Pupils       Pupils APD    Right PERRL     Left PERRL 3+          Visual Fields    HVF 24-2 performed today            Neuro/Psych     Oriented x3: Yes            Slit Lamp and Fundus Exam     External Exam       Right Left    External Normal trace enophthalmos          Slit Lamp Exam       Right Left    Lids/Lashes Normal Normal    Conjunctiva/Sclera White and quiet White and quiet    Cornea Clear Clear    Anterior Chamber Deep and quiet Deep and quiet    Iris Flat Flat    Lens tr ant caps cat anterior capsular cataract           Fundus Exam       Right Left    Vitreous  Normal    Disc  No disc edema, mild pallor    C/D Ratio  0.1            Refraction     Manifest Refraction       Sphere Cylinder Dist VA    Right -0.50 sphere  20/15    Left +0.50 Sphere 20/15-2          Final Rx       Sphere Cylinder Dist VA    Right -0.50 sphere  20/15    Left +0.50 Sphere 20/15-2    Expiration Date: 06/17/2021    Comments: Polycarbonate    Possible sports goggles

## 2020-06-16 NOTE — Assessment & Plan Note
Slight improvement on HVF, but optic nerve pallor    Explained to pt that vision will not fully recover    Recommend polycarbonate glasses - RX given

## 2023-03-03 ENCOUNTER — Encounter: Admit: 2023-03-03 | Discharge: 2023-03-03 | Payer: Private Health Insurance - Indemnity

## 2023-03-03 ENCOUNTER — Ambulatory Visit: Admit: 2023-03-03 | Discharge: 2023-03-03 | Payer: Private Health Insurance - Indemnity

## 2023-03-03 DIAGNOSIS — I10 Essential (primary) hypertension: Secondary | ICD-10-CM

## 2023-03-03 DIAGNOSIS — H468 Other optic neuritis: Secondary | ICD-10-CM

## 2023-03-03 DIAGNOSIS — S0590XA Unspecified injury of unspecified eye and orbit, initial encounter: Secondary | ICD-10-CM

## 2023-03-03 DIAGNOSIS — S0285XD Fracture of orbit, unspecified, subsequent encounter for fracture with routine healing: Secondary | ICD-10-CM

## 2023-03-03 DIAGNOSIS — H01003 Unspecified blepharitis right eye, unspecified eyelid: Secondary | ICD-10-CM

## 2023-03-03 DIAGNOSIS — H5712 Ocular pain, left eye: Secondary | ICD-10-CM

## 2023-03-03 MED ORDER — CEPHALEXIN 500 MG PO CAP
500 mg | ORAL_CAPSULE | Freq: Two times a day (BID) | ORAL | 0 refills | Status: AC
Start: 2023-03-03 — End: ?

## 2023-03-03 NOTE — Progress Notes
Body mass index is 26.63 kg/m?Marland Kitchen  Discussed patient's BMI with him.  The body mass index is 26.63 kg/m?Marland Kitchen and falls within the category of Overweight (25 to <30); specialist visit only, referred back to Primary Care Provider for follow up.             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

## 2023-03-17 ENCOUNTER — Encounter: Admit: 2023-03-17 | Discharge: 2023-03-17 | Payer: Private Health Insurance - Indemnity

## 2023-04-06 ENCOUNTER — Encounter: Admit: 2023-04-06 | Discharge: 2023-04-06 | Payer: Private Health Insurance - Indemnity

## 2023-04-06 ENCOUNTER — Ambulatory Visit: Admit: 2023-04-06 | Discharge: 2023-04-06 | Payer: Private Health Insurance - Indemnity

## 2023-04-06 DIAGNOSIS — S0285XD Fracture of orbit, unspecified, subsequent encounter for fracture with routine healing: Secondary | ICD-10-CM

## 2023-04-13 ENCOUNTER — Encounter: Admit: 2023-04-13 | Discharge: 2023-04-13 | Payer: Private Health Insurance - Indemnity

## 2023-04-13 ENCOUNTER — Ambulatory Visit: Admit: 2023-04-13 | Discharge: 2023-04-14 | Payer: Private Health Insurance - Indemnity

## 2023-04-13 DIAGNOSIS — I10 Essential (primary) hypertension: Secondary | ICD-10-CM

## 2023-04-13 DIAGNOSIS — H05402 Unspecified enophthalmos, left eye: Secondary | ICD-10-CM

## 2023-04-13 DIAGNOSIS — J45909 Unspecified asthma, uncomplicated: Secondary | ICD-10-CM

## 2023-04-13 DIAGNOSIS — G43909 Migraine, unspecified, not intractable, without status migrainosus: Secondary | ICD-10-CM

## 2023-04-13 DIAGNOSIS — S0590XA Unspecified injury of unspecified eye and orbit, initial encounter: Secondary | ICD-10-CM

## 2023-04-13 MED ORDER — BRIMONIDINE 0.2 % OP DROP
1 [drp] | Freq: Two times a day (BID) | OPHTHALMIC | 4 refills | 90.00000 days | Status: AC
Start: 2023-04-13 — End: ?

## 2023-04-13 NOTE — Assessment & Plan Note
HVF slightly worse, but old field not very reliable. OCT-N also shows increased thinning compared to 12/21, but this was only 4 months after the initial injury in August 2021. Unclear if this is ongoing progression or represent damage from initial insult    Pt has borderline IOP    Would recommend brimonidine BID OS for neuroprotection.    - monocular precautions: discussed with pt that due to their monocular status they are at higher-than-normal risk of trauma and injury to their good eye; we recommend glasses made with protective polycarbonate lenses and counseled full-time wear of these glasses

## 2023-04-13 NOTE — Progress Notes
Assessment and Plan:    Problem   Enophthalmos of Left Eye   Traumatic Optic Neuropathy       Traumatic optic neuropathy  HVF slightly worse, but old field not very reliable. OCT-N also shows increased thinning compared to 12/21, but this was only 4 months after the initial injury in August 2021. Unclear if this is ongoing progression or represent damage from initial insult    Pt has borderline IOP    Would recommend brimonidine BID OS for neuroprotection.    - monocular precautions: discussed with pt that due to their monocular status they are at higher-than-normal risk of trauma and injury to their good eye; we recommend glasses made with protective polycarbonate lenses and counseled full-time wear of these glasses      Enophthalmos of left eye  Seen recently by Dr. Wilma Flavin and had CT max face - Dr. Wilma Flavin noted that repeat repair could be an option - recommend f/u with Dr. Wilma Flavin to discuss         Return in about 6 months (around 10/12/2023) for [employer] Return, OCT nerve; IOP, MRx; 2) sokol - next avail - enophthalmos.    Madelaine Etienne, MD  [place] [facility]      HPI:  Patient presents with:  Eye Problem: NP; pt has new glasses but va seems different. Has less glare when driving. OS vf seems like it is worse. Photophobia OS<OD. Night driving headlights make it hard to see safely to drive.   Treatment: none  Other: Has LLL has swelling in the AM most of the time. Lower lid can be swollen to the point it interferers with vision. Can last all day or resolve as the day goes on. Can cause HA to migraines.         Exam:  Base Eye Exam       Visual Acuity (Snellen - Linear)         Right Left    Dist cc 20/20 20/30      Correction: Glasses              Tonometry ([employer] Tonometer, 4:12 PM)         Right Left    Pressure 25 23              Tonometry #2 (Applanation, 4:17 PM)         Right Left    Pressure 21 21              Pupils         APD    Right None    Left +2              Visual Fields    Hvf today             Neuro/Psych       Oriented x3: Yes    Mood/Affect: Normal              Dilation       Both eyes: 1.0% Tropicamide, 2.5% Phenylephrine @ 4:28 PM                  Slit Lamp and Fundus Exam       External Exam         Right Left    External Normal enophthalmos              Slit Lamp Exam         Right Left  Lids/Lashes MGD MGD    Conjunctiva/Sclera White and quiet White and quiet    Cornea Clear Clear    Anterior Chamber Deep and quiet Deep and quiet    Iris Flat Flat    Lens Clear Clear              Fundus Exam         Right Left    Vitreous Normal Normal    Disc Sharp healthy rim pallor    C/D Ratio 0.2 0.3    Macula Flat Flat    Vessels Normal caliber and number Normal caliber and number    Periphery Attached no breaks or tears Attached no breaks or tears                  Refraction       Wearing Rx         Sphere Cylinder Axis    Right -0.25 Sphere     Left +0.00 +0.50 050      Age: new

## 2023-04-13 NOTE — Assessment & Plan Note
Seen recently by Dr. Wilma Flavin and had CT max face - Dr. Wilma Flavin noted that repeat repair could be an option - recommend f/u with Dr. Wilma Flavin to discuss

## 2023-04-14 DIAGNOSIS — H468 Other optic neuritis: Secondary | ICD-10-CM

## 2023-05-02 ENCOUNTER — Encounter: Admit: 2023-05-02 | Discharge: 2023-05-02 | Payer: Private Health Insurance - Indemnity

## 2023-05-02 ENCOUNTER — Ambulatory Visit: Admit: 2023-05-02 | Discharge: 2023-05-03 | Payer: Private Health Insurance - Indemnity

## 2023-05-02 DIAGNOSIS — H468 Other optic neuritis: Secondary | ICD-10-CM

## 2023-05-02 DIAGNOSIS — S022XXS Fracture of nasal bones, sequela: Secondary | ICD-10-CM

## 2023-05-02 DIAGNOSIS — S0285XD Fracture of orbit, unspecified, subsequent encounter for fracture with routine healing: Secondary | ICD-10-CM

## 2023-05-02 DIAGNOSIS — H05402 Unspecified enophthalmos, left eye: Secondary | ICD-10-CM

## 2023-05-02 NOTE — Progress Notes
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

## 2023-07-14 ENCOUNTER — Encounter: Admit: 2023-07-14 | Discharge: 2023-07-14 | Payer: Private Health Insurance - Indemnity

## 2023-10-12 ENCOUNTER — Ambulatory Visit: Admit: 2023-10-12 | Discharge: 2023-10-13 | Payer: PRIVATE HEALTH INSURANCE

## 2023-10-12 ENCOUNTER — Encounter: Admit: 2023-10-12 | Discharge: 2023-10-12 | Payer: PRIVATE HEALTH INSURANCE

## 2023-10-12 ENCOUNTER — Ambulatory Visit: Admit: 2023-10-12 | Discharge: 2023-10-12 | Payer: PRIVATE HEALTH INSURANCE

## 2023-10-12 DIAGNOSIS — H05402 Unspecified enophthalmos, left eye: Secondary | ICD-10-CM

## 2023-10-12 DIAGNOSIS — H468 Other optic neuritis: Secondary | ICD-10-CM

## 2023-10-12 MED ORDER — BRIMONIDINE 0.2 % OP DROP
1 [drp] | Freq: Two times a day (BID) | OPHTHALMIC | 4 refills | 90.00000 days | Status: AC
Start: 2023-10-12 — End: ?

## 2023-10-12 NOTE — Assessment & Plan Note
 Optic neuropathy stable    Ok to to proceed with further surgery if Dr. Sokol agrees

## 2023-11-28 ENCOUNTER — Encounter: Admit: 2023-11-28 | Discharge: 2023-11-28 | Payer: PRIVATE HEALTH INSURANCE

## 2024-04-25 ENCOUNTER — Encounter: Admit: 2024-04-25 | Discharge: 2024-04-25 | Payer: PRIVATE HEALTH INSURANCE
